# Patient Record
Sex: Female | Born: 1970 | Race: White | Hispanic: Yes | Marital: Married | Smoking: Current every day smoker
Health system: Southern US, Community
[De-identification: ages and names within clinical notes are randomized; demographics above are authoritative.]

---

## 2021-05-09 ENCOUNTER — Emergency Department (HOSPITAL_COMMUNITY): Payer: Self-pay

## 2021-05-09 ENCOUNTER — Other Ambulatory Visit: Payer: Self-pay

## 2021-05-09 ENCOUNTER — Emergency Department (HOSPITAL_COMMUNITY)
Admission: EM | Admit: 2021-05-09 | Discharge: 2021-05-09 | Disposition: A | Discharge status: Home / Self Care | Payer: Self-pay | Attending: Emergency Medicine | Admitting: Emergency Medicine

## 2021-05-09 DIAGNOSIS — E876 Hypokalemia: Secondary | ICD-10-CM | POA: Insufficient documentation

## 2021-05-09 DIAGNOSIS — N939 Abnormal uterine and vaginal bleeding, unspecified: Secondary | ICD-10-CM | POA: Insufficient documentation

## 2021-05-09 LAB — COMPREHENSIVE METABOLIC PANEL
ALT: 131 U/L — ABNORMAL HIGH (ref 0–44)
AST: 108 U/L — ABNORMAL HIGH (ref 15–41)
Albumin: 3.8 g/dL (ref 3.5–5.0)
Alkaline Phosphatase: 77 U/L (ref 38–126)
Anion gap: 7 (ref 5–15)
BUN: 12 mg/dL (ref 6–20)
CO2: 23 mmol/L (ref 22–32)
Calcium: 8.5 mg/dL — ABNORMAL LOW (ref 8.9–10.3)
Chloride: 107 mmol/L (ref 98–111)
Creatinine, Ser: 0.64 mg/dL (ref 0.44–1.00)
GFR, Estimated: >60 mL/min (ref >60)
Glucose, Bld: 85 mg/dL (ref 70–99)
Potassium: 3.1 mmol/L — ABNORMAL LOW (ref 3.5–5.1)
Sodium: 137 mmol/L (ref 135–145)
Total Bilirubin: 0.4 mg/dL (ref 0.3–1.2)
Total Protein: 6.7 g/dL (ref 6.5–8.1)

## 2021-05-09 LAB — CBC
HCT: 34.4 % — ABNORMAL LOW (ref 36.0–46.0)
Hemoglobin: 11.4 g/dL — ABNORMAL LOW (ref 12.0–15.0)
MCH: 31.6 pg (ref 26.0–34.0)
MCHC: 33.1 g/dL (ref 30.0–36.0)
MCV: 95.3 fL (ref 80.0–100.0)
Platelets: 288 10*3/uL (ref 150–400)
RBC: 3.61 MIL/uL — ABNORMAL LOW (ref 3.87–5.11)
RDW: 13.7 % (ref 11.5–15.5)
WBC: 8.8 10*3/uL (ref 4.0–10.5)
nRBC: 0 % (ref 0.0–0.2)

## 2021-05-09 LAB — TYPE AND SCREEN
ABO/RH(D): O POS
Antibody Screen: NEGATIVE

## 2021-05-09 LAB — I-STAT BETA HCG BLOOD, ED (MC, WL, AP ONLY): I-stat hCG, quantitative: <5 m[IU]/mL (ref <5)

## 2021-05-09 MED ORDER — POTASSIUM CHLORIDE CRYS ER 20 MEQ PO TBCR
40.0000 meq | EXTENDED_RELEASE_TABLET | Freq: Once | ORAL | Status: AC
  Administered 2021-05-09: 23:00:00 40 meq via ORAL
  Filled 2021-05-09: qty 2

## 2021-05-09 MED ORDER — TRANEXAMIC ACID 650 MG PO TABS
1300.0000 mg | ORAL_TABLET | Freq: Three times a day (TID) | ORAL | 0 refills | Status: AC
Start: 2021-05-09 — End: 2021-05-12

## 2021-05-09 NOTE — ED Triage Notes (Signed)
Pt reports 15 days of vaginal bleeding. Denies pain. Reports copious bleeding with large clots. Reports generalized weakness.

## 2021-05-09 NOTE — ED Provider Notes (Signed)
[facility] EMERGENCY DEPARTMENT Provider Note   CSN: [id number] Arrival date & time: 05/09/21  1150     History Chief Complaint  Patient presents with   Vaginal Bleeding    Renee Palmer is a 50 y.o. female.  HPI  50 year old female presents the emergency department with concern for vaginal bleeding.  Patient is Spanish-speaking, interpreter services used.  Patient states since she was 41 she is been having intermittent episodes of abnormal uterine bleeding.  She is to follow-up with an OB/GYN.  Unclear what the etiology was.  She states the last time this happened she had to take a 3-day course of medications and it subsided.  She is new to the area, in the process of establishing OB/GYN.  States that she has had daily bleeding, states that she has been passing large clots.  Feels fatigued but denies any chest pain, shortness of breath, dizziness.  She is not on any blood thinning medication.  No fever or other abnormal vaginal bleeding.  No pelvic pain.  No past medical history on file.  There are no problems to display for this patient.   The histories are not reviewed yet. Please review them in the "History" navigator section and refresh this SmartLink.   OB History   No obstetric history on file.     No family history on file.     Home Medications Prior to Admission medications   Medication Sig Start Date End Date Taking? Authorizing Provider  tranexamic acid (LYSTEDA) 650 MG TABS tablet Take 2 tablets (1,300 mg total) by mouth 3 (three) times daily for 3 days. 05/09/21 05/12/21 Yes Avanti Jetter, Clabe Seal, DO    Allergies    Patient has no known allergies.  Review of Systems   Review of Systems  Constitutional:  Positive for fatigue. Negative for fever.  HENT:  Negative for congestion.   Respiratory:  Negative for shortness of breath.   Cardiovascular:  Negative for chest pain.  Gastrointestinal:  Negative for abdominal pain and  vomiting.  Genitourinary:  Positive for vaginal bleeding. Negative for pelvic pain, vaginal discharge and vaginal pain.  Skin:  Negative for rash.  Neurological:  Negative for light-headedness.   Physical Exam Updated Vital Signs BP 123/77    Pulse 78    Temp 98.8 F (37.1 C)    Resp 16    SpO2 98%   Physical Exam Vitals and nursing note reviewed.  Constitutional:      General: She is not in acute distress.    Appearance: Normal appearance. She is not ill-appearing.  HENT:     Head: Normocephalic.     Mouth/Throat:     Mouth: Mucous membranes are moist.  Cardiovascular:     Rate and Rhythm: Normal rate.  Pulmonary:     Effort: Pulmonary effort is normal. No respiratory distress.  Abdominal:     Palpations: Abdomen is soft.     Tenderness: There is no abdominal tenderness.  Genitourinary:    Comments: Deferred Skin:    General: Skin is warm.     Coloration: Skin is not pale.  Neurological:     Mental Status: She is alert and oriented to person, place, and time. Mental status is at baseline.  Psychiatric:        Mood and Affect: Mood normal.    ED Results / Procedures / Treatments   Labs (all labs ordered are listed, but only abnormal results are displayed) Labs Reviewed  COMPREHENSIVE METABOLIC PANEL -  Abnormal; Notable for the following components:      Result Value   Potassium 3.1 (*)    Calcium 8.5 (*)    AST 108 (*)    ALT 131 (*)    All other components within normal limits  CBC - Abnormal; Notable for the following components:   RBC 3.61 (*)    Hemoglobin 11.4 (*)    HCT 34.4 (*)    All other components within normal limits  I-STAT BETA HCG BLOOD, ED ([facility], [facility], [facility] ONLY)  TYPE AND SCREEN  ABO/RH    EKG None  Radiology US PELVIC COMPLETE W TRANSVAGINAL AND TORSION R/O  Result Date: 05/09/2021 CLINICAL DATA:  Vaginal bleeding EXAM: TRANSABDOMINAL AND TRANSVAGINAL ULTRASOUND OF PELVIS DOPPLER ULTRASOUND OF OVARIES TECHNIQUE: Both transabdominal and  transvaginal ultrasound examinations of the pelvis were performed. Transabdominal technique was performed for global imaging of the pelvis including uterus, ovaries, adnexal regions, and pelvic cul-de-sac. It was necessary to proceed with endovaginal exam following the transabdominal exam to visualize the ovaries and endometrium. Color and duplex Doppler ultrasound was utilized to evaluate blood flow to the ovaries. COMPARISON:  None. FINDINGS: Uterus Measurements: 9.1 x 5.7 x 6.8 cm = volume: 186 mL. Retroverted uterus. Nodular myometrium consistent with uterine fibroids. Individual nodules are measured at up to about 2.5 cm maximal diameter. Endometrium Thickness: 9 mm.  No focal abnormality visualized. Right ovary Measurements: 2.6 x 2.4 x 2.4 cm = volume: 8 mL. Normal appearance/no adnexal mass. Left ovary Measurements: 3.8 x 1.8 x 2.4 cm = volume: 8 mL. Normal appearance/no adnexal mass. Pulsed Doppler evaluation of both ovaries demonstrates normal low-resistance arterial and venous waveforms. Other findings Moderate free fluid demonstrated in the pelvis. IMPRESSION: 1. Heterogeneous uterine myometrium with small uterine fibroids. 2. Normal endometrial stripe appearance. 3. Both ovaries appear normal. 4. Moderate free fluid in the pelvis, likely physiologic. Electronically Signed   By: Burman Nieves M.D.   On: 05/09/2021 15:03    Procedures Procedures   Medications Ordered in ED Medications  potassium chloride SA (KLOR-CON M) CR tablet 40 mEq (40 mEq Oral Given 05/09/21 2236)    ED Course  I have reviewed the triage vital signs and the nursing notes.  Pertinent labs & imaging results that were available during my care of the patient were reviewed by me and considered in my medical decision making (see chart for details).    MDM Rules/Calculators/A&P                          50 year old female presents emergency department with ongoing vaginal bleeding.  Vitals are stable on arrival.  She  is well-appearing, no findings of acute anemia.  Blood work shows a hemoglobin of 11, no known baseline.  Mildly low potassium.  Pelvic exam deferred.  Ultrasound shows uterine fibroids, patient unaware of this diagnosis.  Most likely the source of her abnormal uterine bleeding.  Patient states she took a 3-day course of medication prior, this sounds like a TXA course.  When offered oral hormonal therapy as first-line she declined.  Plan to send prescription for TXA and encouraged outpatient OB/GYN follow-up for further evaluation, preventative measures.  Patient does not look acutely anemic, no indication for transfusion.  Patient at this time appears safe and stable for discharge and will be treated as an outpatient.  Discharge plan and strict return to ED precautions discussed, patient verbalizes understanding and agreement.     Final Clinical  Impression(s) / ED Diagnoses Final diagnoses:  Vaginal bleeding    Rx / DC Orders ED Discharge Orders          Ordered    tranexamic acid (LYSTEDA) 650 MG TABS tablet  3 times daily        05/09/21 2240             Majesta Leichter, Clabe Seal, DO 05/09/21 2249

## 2021-05-09 NOTE — Discharge Instructions (Addendum)
You have been seen and discharged from the emergency department.  Your blood work showed mildly low potassium but was otherwise stable.  Ultrasound showed small uterine fibroids.  It is extremely important that you establish care with a gynecologist for further evaluation and preventative treatment.  Take medication as directed.  Stay well-hydrated.  Follow-up with your primary provider for reevaluation and further care. Take home medications as prescribed. If you have any worsening symptoms or further concerns for your health please return to an emergency department for further evaluation.

## 2021-05-09 NOTE — ED Provider Notes (Addendum)
Emergency Medicine Provider Triage Evaluation Note  Renee Palmer , a 50 y.o. female  was evaluated in triage.  Pt complains of 15 days of heavy vaginal bleeding.  She is wearing incontinence briefs due to the quantity of bleeding.  States she has to change them every 30 minutes.  She is feeling lightheaded and very fatigued, intermittently shortness of breath with exertion.  History of uterine hemorrhage in the past that did not require blood transfusion but did require medical intervention.  History of irregular periods for the last 9 years.  Review of Systems  Positive: Vaginal bleeding, Right sided pelvic pain Negative: Fevers, chills, dysuria  Physical Exam  BP 128/89 (BP Location: Right Arm)    Pulse 74    Temp 98.9 F (37.2 C) (Oral)    Resp 14    SpO2 98%  Gen:   Awake, no distress   Resp:  Normal effort  MSK:   Moves extremities without difficulty  Other:  Or arms or associate.  Lung CTA B.  Abdomen soft, nondistended, nontender.  Medical Decision Making  Medically screening exam initiated at 1:14 PM.  Appropriate orders placed.  Renee Palmer was informed that the remainder of the evaluation will be completed by another provider, this initial triage assessment does not replace that evaluation, and the importance of remaining in the ED until their evaluation is complete.  This chart was dictated using voice recognition software, Dragon. Despite the best efforts of this provider to proofread and correct errors, errors may still occur which can change documentation meaning.    Paris Lore, PA-C 05/09/21 [address], [name], PA-C 05/09/21 1428    Pollyann Savoy, MD 05/09/21 [phone]

## 2021-08-17 ENCOUNTER — Ambulatory Visit: Payer: Self-pay | Admitting: Internal Medicine

## 2022-11-08 ENCOUNTER — Emergency Department (HOSPITAL_COMMUNITY)
Admission: EM | Admit: 2022-11-08 | Discharge: 2022-11-08 | Disposition: A | Discharge status: Home / Self Care | Payer: Self-pay | Attending: Emergency Medicine | Admitting: Emergency Medicine

## 2022-11-08 ENCOUNTER — Other Ambulatory Visit: Payer: Self-pay

## 2022-11-08 ENCOUNTER — Encounter (HOSPITAL_COMMUNITY): Payer: Self-pay

## 2022-11-08 DIAGNOSIS — L03317 Cellulitis of buttock: Secondary | ICD-10-CM | POA: Insufficient documentation

## 2022-11-08 MED ORDER — ACETAMINOPHEN 500 MG PO TABS: 1000.0000 mg | ORAL_TABLET | Freq: Four times a day (QID) | ORAL | 0 refills | Status: AC | PRN

## 2022-11-08 MED ORDER — CEPHALEXIN 500 MG PO CAPS: 500.0000 mg | ORAL_CAPSULE | Freq: Four times a day (QID) | ORAL | 0 refills | Status: AC

## 2022-11-08 NOTE — Discharge Instructions (Addendum)
You have what appears to be cellulitis or skin infection of your buttocks.  I am starting you on an antibiotic called Keflex.  Will like you to take this for the entire course even if your symptoms improve a few days then.  Please return immediately to emergency room for any new or concerning symptoms specifically if you develop a fever or if your symptoms or not improving in 4 to 5 days.  Tylenol 1000mg  every 6 hours for pain as needed.    Tiene lo que parece ser celulitis o infeccin de la piel de los glteos.  Te estoy empezando a recetar un antibitico llamado Keflex.  Me gustar que tome esto durante todo el curso, incluso si sus sntomas mejoran [employer].  Regrese inmediatamente a la sala de emergencias para cualquier sntoma nuevo o preocupante, especficamente si desarrolla fiebre o si sus sntomas no mejoran en 4 a 5 das.  Tylenol 1000mg  cada 6 horas para el dolor segn sea necesario.   Work notes if needed due to discomfort.

## 2022-11-08 NOTE — ED Triage Notes (Signed)
Pt arrived POV with abscess on buttocks x3 months. Advised to  come to ED from urgent care

## 2022-11-08 NOTE — ED Provider Notes (Signed)
[place] EMERGENCY DEPARTMENT AT [facility] Provider Note   CSN: [id number] Arrival date & time: 11/08/22  1635     History  Chief Complaint  Patient presents with   Abscess         Renee Palmer is a 53 y.o. female.   Abscess  Patient is a 52 year old female with no pertinent past medical history nondiabetic she is present emergency room today with complaints of left buttocks discomfort for 3 months.  She denies any other significant symptoms.  No urinary frequency urgency dysuria hematuria.  No fevers or chills nausea or vomiting no trauma to her buttocks.      Home Medications Prior to Admission medications   Medication Sig Start Date End Date Taking? Authorizing Provider  acetaminophen (TYLENOL) 500 MG tablet Take 2 tablets (1,000 mg total) by mouth every 6 (six) hours as needed. 11/08/22  Yes Lonna Rabold S, PA  cephALEXin (KEFLEX) 500 MG capsule Take 1 capsule (500 mg total) by mouth 4 (four) times daily. 11/08/22  Yes Gailen Shelter, PA      Allergies    Patient has no known allergies.    Review of Systems   Review of Systems  Physical Exam Updated Vital Signs BP 136/87   Pulse 84   Temp 98 F (36.7 C) (Oral)   Resp 16   Ht 5\' 5"  (1.651 m)   Wt 80.7 kg   SpO2 97%   BMI 29.62 kg/m  Physical Exam Vitals and nursing note reviewed.  Constitutional:      General: She is not in acute distress.    Appearance: Normal appearance. She is not ill-appearing.  HENT:     Head: Normocephalic and atraumatic.     Nose: Nose normal.     Mouth/Throat:     Mouth: Mucous membranes are moist.  Eyes:     General: No scleral icterus.       Right eye: No discharge.        Left eye: No discharge.     Conjunctiva/sclera: Conjunctivae normal.  Cardiovascular:     Rate and Rhythm: Normal rate and regular rhythm.     Pulses: Normal pulses.     Heart sounds: Normal heart sounds.  Pulmonary:     Effort: Pulmonary effort is normal. No  respiratory distress.     Breath sounds: No stridor. No wheezing.  Abdominal:     Palpations: Abdomen is soft.     Tenderness: There is no abdominal tenderness.  Musculoskeletal:     Cervical back: Normal range of motion.     Right lower leg: No edema.     Left lower leg: No edema.  Skin:    General: Skin is warm and dry.     Capillary Refill: Capillary refill takes less than 2 seconds.     Comments: Left buttocks with slightly erythematous cellulitic appearing rash.  No purulent wounds or wounds of any kind.  No bleeding.  Area blanches with pressure.  No fluctuance.  Area is approximately 10 cm x 8 cm oval-shaped not sharply demarcated.  Neurological:     Mental Status: She is alert and oriented to person, place, and time. Mental status is at baseline.  Psychiatric:        Mood and Affect: Mood normal.        Behavior: Behavior normal.     ED Results / Procedures / Treatments   Labs (all labs ordered are listed, but only abnormal results are  displayed) Labs Reviewed - No data to display  EKG None  Radiology No results found.  Procedures Procedures    Medications Ordered in ED Medications - No data to display  ED Course/ Medical Decision Making/ A&P                             Medical Decision Making Risk OTC drugs. Prescription drug management.   Patient is a 52 year old female with no pertinent past medical history nondiabetic she is present emergency room today with complaints of left buttocks discomfort for 3 months.  She denies any other significant symptoms.  No urinary frequency urgency dysuria hematuria.  No fevers or chills nausea or vomiting no trauma to her buttocks.  Physical exam with mild cellulitic appearing rash to the left buttocks.  Will treat with Keflex and recommend Tylenol.  Warm compresses.  I personally did a bedside ultrasound and did not appreciate any abscesses.  She only follow-up in 4 to 5 days if not improving will need to return  the emergency room.  Strict return precautions emergency room provided to patient.  She is agreeable to plan.  The entirety of visit was translated by her friend.  The patient states that they preferred to have their friend translate and declined formal translator.  Final Clinical Impression(s) / ED Diagnoses Final diagnoses:  Cellulitis of buttock    Rx / DC Orders ED Discharge Orders          Ordered    cephALEXin (KEFLEX) 500 MG capsule  4 times daily        11/08/22 1758    acetaminophen (TYLENOL) 500 MG tablet  Every 6 hours PRN        11/08/22 1758              Gailen Shelter, [place] 11/08/22 1901    Maia Plan, MD 11/13/22 0210

## 2022-12-30 IMAGING — US US PELVIS COMPLETE TRANSABD/TRANSVAG W DUPLEX AND/OR DOPPLER
2 of 3 series · 13 of 25 positions shown · non-contrast
Comparison: None.

CLINICAL DATA: Vaginal bleeding



[Series 1: us pelvic complete w transvaginal and torsion righ · 12 of 115 slices shown]
[im 1/115]
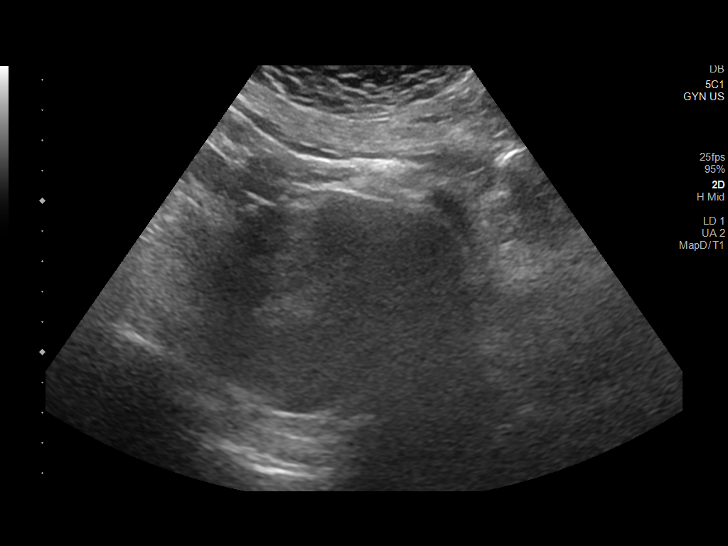
[im 11/115]
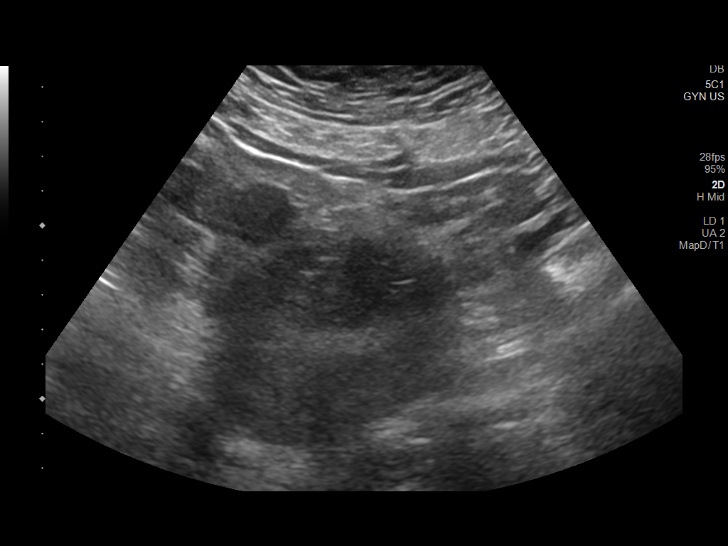
[im 21/115]
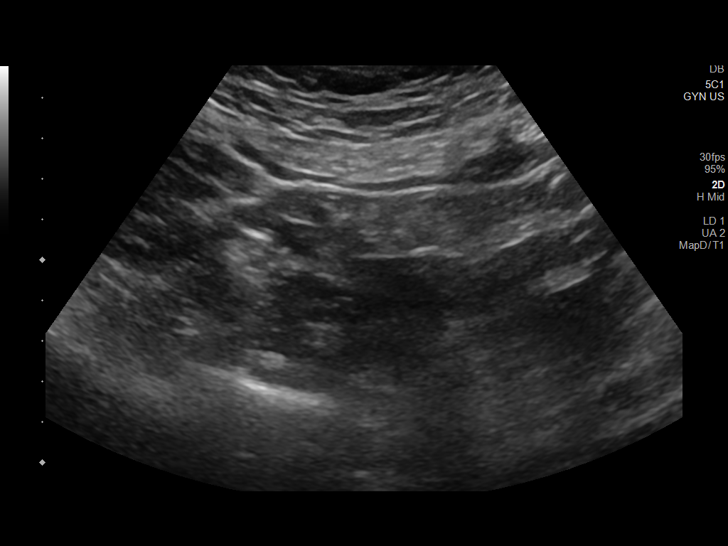
[im 32/115]
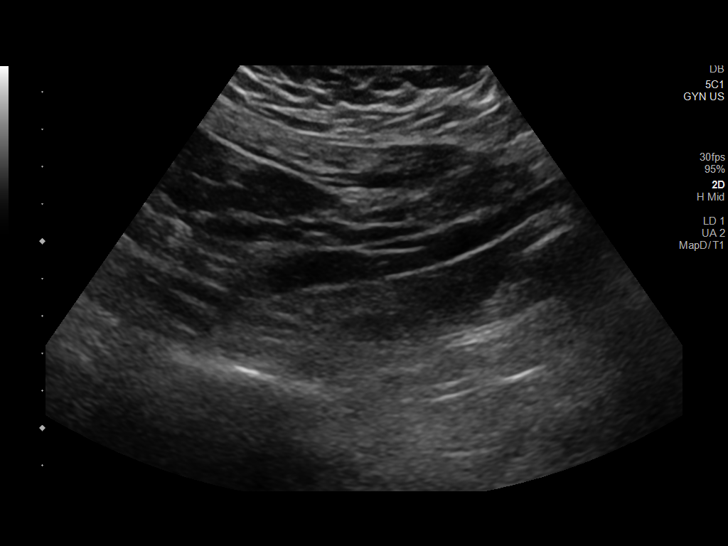
[im 42/115]
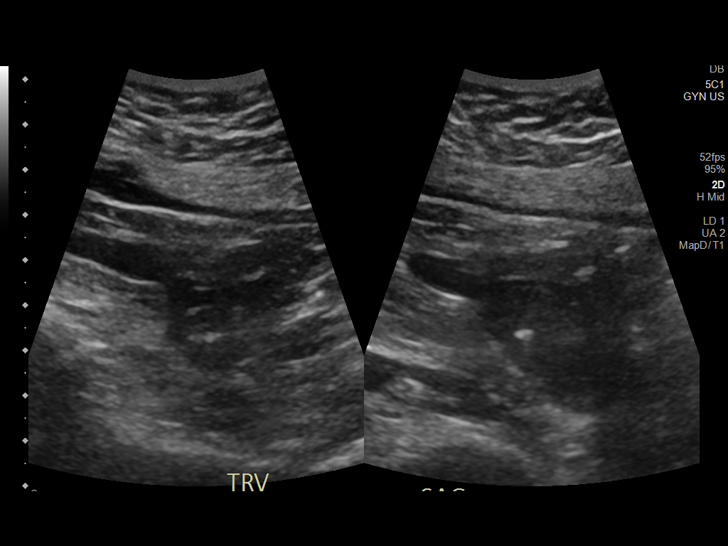
[im 52/115]
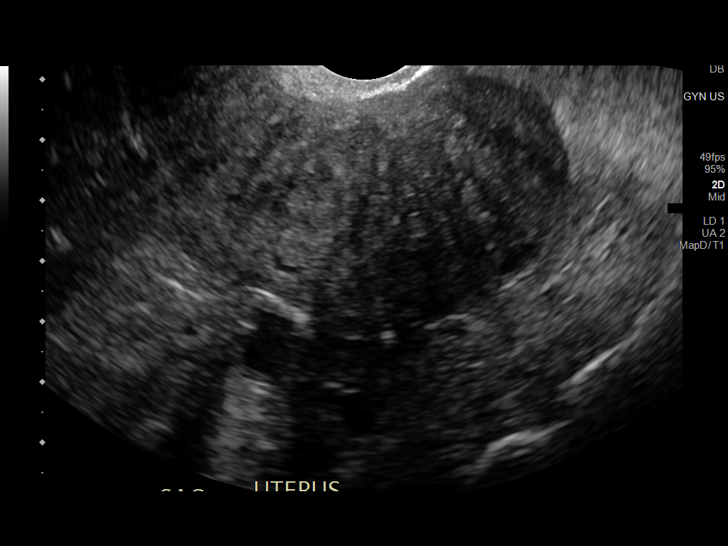
[im 63/115]
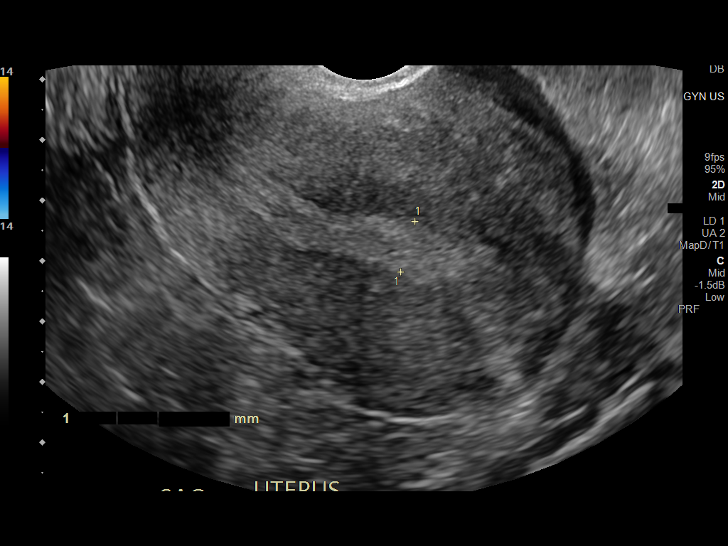
[im 73/115]
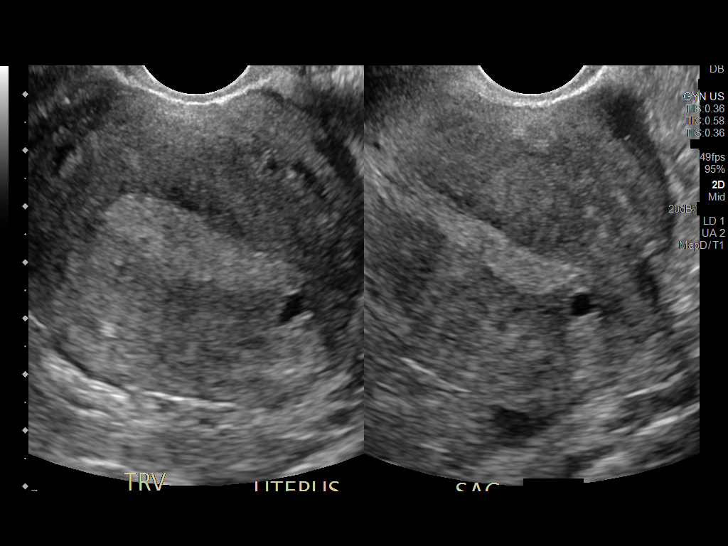
[im 83/115]
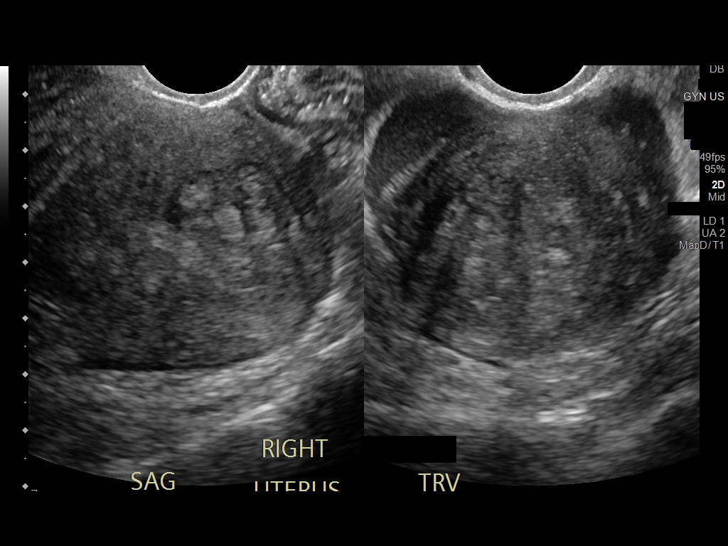
[im 94/115]
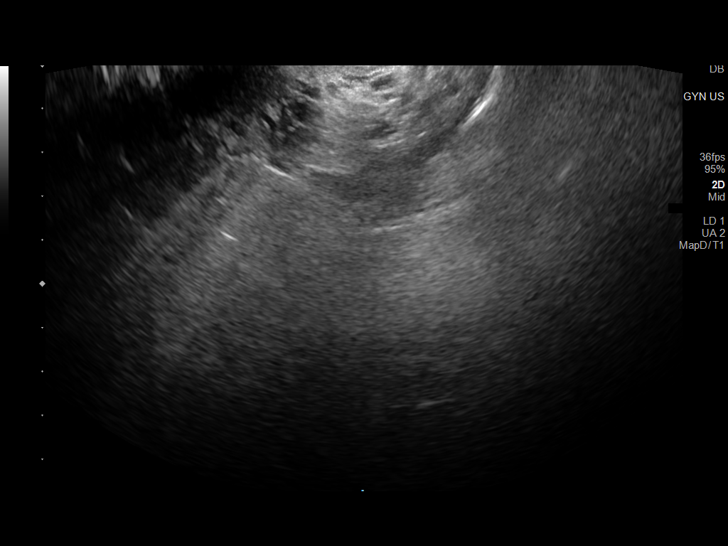
[im 104/115]
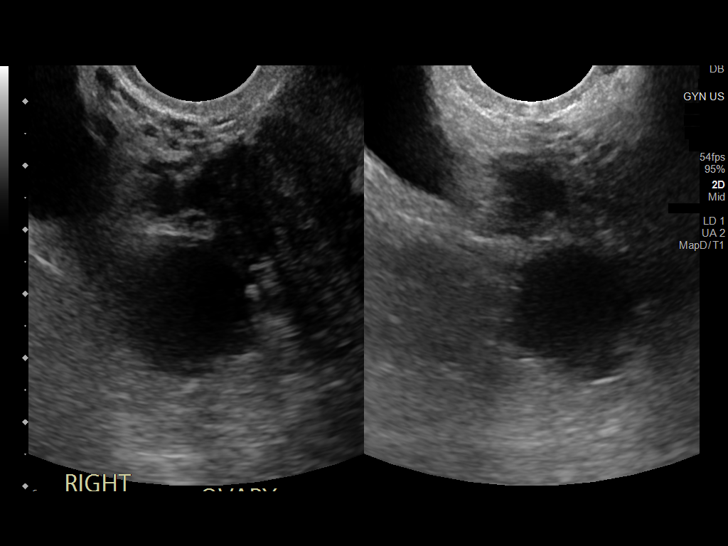
[im 115/115]
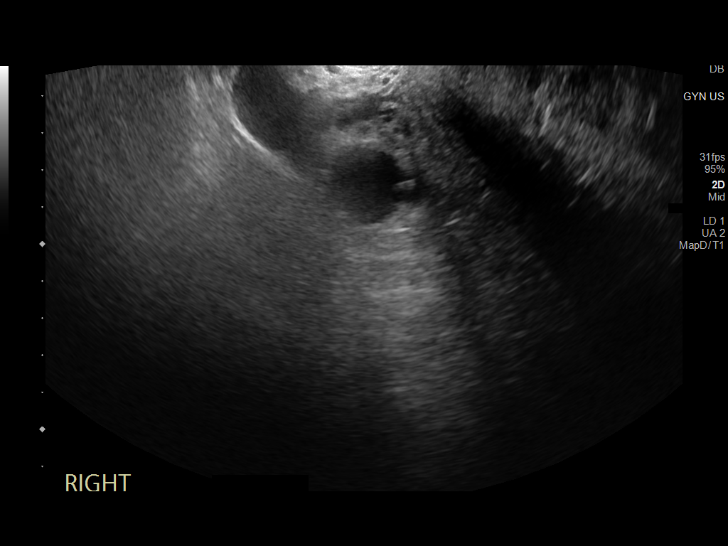

[Series 1002: gyn us · 1 of 1 slices shown]
[im 1/1]
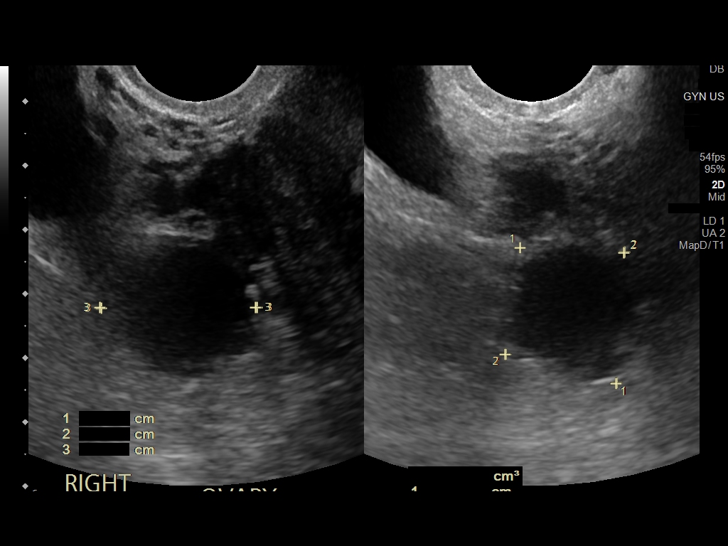

[13 of 25 positions shown; findings below may reference images not displayed]

FINDINGS: Uterus

Measurements: 9.1 x 5.7 x 6.8 cm = volume: 186 mL. Retroverted
uterus. Nodular myometrium consistent with uterine fibroids.
Individual nodules are measured at up to about 2.5 cm maximal
diameter.

Endometrium

Thickness: 9 mm.  No focal abnormality visualized.

Right ovary

Measurements: 2.6 x 2.4 x 2.4 cm = volume: 8 mL. Normal
appearance/no adnexal mass.

Left ovary

Measurements: 3.8 x 1.8 x 2.4 cm = volume: 8 mL. Normal
appearance/no adnexal mass.

Pulsed Doppler evaluation of both ovaries demonstrates normal
low-resistance arterial and venous waveforms.

Other findings

Moderate free fluid demonstrated in the pelvis.
IMPRESSION: 1. Heterogeneous uterine myometrium with small uterine fibroids.
2. Normal endometrial stripe appearance.
3. Both ovaries appear normal.
4. Moderate free fluid in the pelvis, likely physiologic.
